# Patient Record
Sex: Female | Born: 1985 | Race: Black or African American | Hispanic: No | Marital: Single
Health system: Southern US, Community
[De-identification: ages and names within clinical notes are randomized; demographics above are authoritative.]

## PROBLEM LIST (undated history)

## (undated) DIAGNOSIS — K56609 Unspecified intestinal obstruction, unspecified as to partial versus complete obstruction: Secondary | ICD-10-CM

## (undated) HISTORY — PX: BOWEL RESECTION: SHX1257

---

## 2016-06-21 ENCOUNTER — Encounter (HOSPITAL_COMMUNITY): Payer: Self-pay | Admitting: Emergency Medicine

## 2016-06-21 ENCOUNTER — Inpatient Hospital Stay (HOSPITAL_COMMUNITY)
Admission: EM | Admit: 2016-06-21 | Discharge: 2016-06-24 | DRG: 390 | Disposition: A | Discharge status: Home / Self Care | Payer: Self-pay | Attending: Surgery | Admitting: Surgery

## 2016-06-21 ENCOUNTER — Emergency Department (HOSPITAL_COMMUNITY): Payer: Self-pay

## 2016-06-21 ENCOUNTER — Inpatient Hospital Stay (HOSPITAL_COMMUNITY): Payer: Self-pay

## 2016-06-21 DIAGNOSIS — E669 Obesity, unspecified: Secondary | ICD-10-CM

## 2016-06-21 DIAGNOSIS — K589 Irritable bowel syndrome without diarrhea: Secondary | ICD-10-CM | POA: Diagnosis present

## 2016-06-21 DIAGNOSIS — K56609 Unspecified intestinal obstruction, unspecified as to partial versus complete obstruction: Secondary | ICD-10-CM | POA: Diagnosis present

## 2016-06-21 DIAGNOSIS — K56600 Partial intestinal obstruction, unspecified as to cause: Principal | ICD-10-CM | POA: Diagnosis present

## 2016-06-21 DIAGNOSIS — Z9049 Acquired absence of other specified parts of digestive tract: Secondary | ICD-10-CM

## 2016-06-21 DIAGNOSIS — Z8719 Personal history of other diseases of the digestive system: Secondary | ICD-10-CM

## 2016-06-21 DIAGNOSIS — Z0189 Encounter for other specified special examinations: Secondary | ICD-10-CM

## 2016-06-21 DIAGNOSIS — Z6838 Body mass index (BMI) 38.0-38.9, adult: Secondary | ICD-10-CM

## 2016-06-21 HISTORY — DX: Unspecified intestinal obstruction, unspecified as to partial versus complete obstruction: K56.609

## 2016-06-21 LAB — URINALYSIS, ROUTINE W REFLEX MICROSCOPIC
BILIRUBIN URINE: NEGATIVE
Glucose, UA: NEGATIVE mg/dL
Hgb urine dipstick: NEGATIVE
KETONES UR: 20 mg/dL — AB
Nitrite: NEGATIVE
PROTEIN: NEGATIVE mg/dL
Specific Gravity, Urine: >1.046 — ABNORMAL HIGH (ref 1.005–1.030)
pH: 7 (ref 5.0–8.0)

## 2016-06-21 LAB — COMPREHENSIVE METABOLIC PANEL
ALT: 19 U/L (ref 14–54)
AST: 30 U/L (ref 15–41)
Albumin: 4.4 g/dL (ref 3.5–5.0)
Alkaline Phosphatase: 65 U/L (ref 38–126)
Anion gap: 8 (ref 5–15)
BUN: 7 mg/dL (ref 6–20)
CHLORIDE: 105 mmol/L (ref 101–111)
CO2: 23 mmol/L (ref 22–32)
CREATININE: 0.87 mg/dL (ref 0.44–1.00)
Calcium: 9.4 mg/dL (ref 8.9–10.3)
GFR calc non Af Amer: >60 mL/min (ref >60)
Glucose, Bld: 101 mg/dL — ABNORMAL HIGH (ref 65–99)
Potassium: 4.8 mmol/L (ref 3.5–5.1)
SODIUM: 136 mmol/L (ref 135–145)
Total Bilirubin: 1.2 mg/dL (ref 0.3–1.2)
Total Protein: 7.7 g/dL (ref 6.5–8.1)

## 2016-06-21 LAB — CBC
HCT: 41.5 % (ref 36.0–46.0)
HEMOGLOBIN: 13.5 g/dL (ref 12.0–15.0)
MCH: 29.3 pg (ref 26.0–34.0)
MCHC: 32.5 g/dL (ref 30.0–36.0)
MCV: 90 fL (ref 78.0–100.0)
Platelets: 212 10*3/uL (ref 150–400)
RBC: 4.61 MIL/uL (ref 3.87–5.11)
RDW: 13.6 % (ref 11.5–15.5)
WBC: 13 10*3/uL — ABNORMAL HIGH (ref 4.0–10.5)

## 2016-06-21 LAB — I-STAT BETA HCG BLOOD, ED (MC, WL, AP ONLY)

## 2016-06-21 LAB — LIPASE, BLOOD: LIPASE: 25 U/L (ref 11–51)

## 2016-06-21 MED ORDER — KETOROLAC TROMETHAMINE 30 MG/ML IJ SOLN: 30.0000 mg | Freq: Four times a day (QID) | INTRAMUSCULAR | Status: DC | PRN

## 2016-06-21 MED ORDER — ENOXAPARIN SODIUM 40 MG/0.4ML ~~LOC~~ SOLN
40.0000 mg | SUBCUTANEOUS | Status: DC
  Filled 2016-06-21 (×2): qty 0.4

## 2016-06-21 MED ORDER — WHITE PETROLATUM GEL
Status: AC
  Administered 2016-06-22: 1
  Filled 2016-06-21: qty 1

## 2016-06-21 MED ORDER — ONDANSETRON HCL 4 MG/2ML IJ SOLN
4.0000 mg | Freq: Once | INTRAMUSCULAR | Status: AC
  Administered 2016-06-21: 4 mg via INTRAVENOUS
  Filled 2016-06-21: qty 2

## 2016-06-21 MED ORDER — DIATRIZOATE MEGLUMINE & SODIUM 66-10 % PO SOLN
90.0000 mL | Freq: Once | ORAL | Status: AC
  Administered 2016-06-22: 90 mL via NASOGASTRIC
  Filled 2016-06-21: qty 90

## 2016-06-21 MED ORDER — DEXTROSE-NACL 5-0.9 % IV SOLN
INTRAVENOUS | Status: DC
  Administered 2016-06-22 – 2016-06-24 (×5): via INTRAVENOUS

## 2016-06-21 MED ORDER — HYDRALAZINE HCL 20 MG/ML IJ SOLN: 10.0000 mg | INTRAMUSCULAR | Status: DC | PRN

## 2016-06-21 MED ORDER — ONDANSETRON 4 MG PO TBDP: 4.0000 mg | ORAL_TABLET | Freq: Four times a day (QID) | ORAL | Status: DC | PRN

## 2016-06-21 MED ORDER — KETOROLAC TROMETHAMINE 30 MG/ML IJ SOLN
30.0000 mg | Freq: Four times a day (QID) | INTRAMUSCULAR | Status: DC | PRN
  Administered 2016-06-22: 30 mg via INTRAVENOUS
  Filled 2016-06-21: qty 1

## 2016-06-21 MED ORDER — HYDROMORPHONE HCL 1 MG/ML IJ SOLN
1.0000 mg | Freq: Once | INTRAMUSCULAR | Status: AC
  Administered 2016-06-21: 1 mg via INTRAVENOUS
  Filled 2016-06-21: qty 1

## 2016-06-21 MED ORDER — IOPAMIDOL (ISOVUE-300) INJECTION 61%
INTRAVENOUS | Status: AC
  Administered 2016-06-21: 100 mL
  Filled 2016-06-21: qty 100

## 2016-06-21 MED ORDER — ONDANSETRON HCL 4 MG/2ML IJ SOLN
4.0000 mg | Freq: Four times a day (QID) | INTRAMUSCULAR | Status: DC | PRN
  Administered 2016-06-22 (×3): 4 mg via INTRAVENOUS
  Filled 2016-06-21 (×3): qty 2

## 2016-06-21 MED ORDER — HYDROMORPHONE HCL 1 MG/ML IJ SOLN
1.0000 mg | INTRAMUSCULAR | Status: DC | PRN
  Administered 2016-06-21 – 2016-06-22 (×4): 1 mg via INTRAVENOUS
  Filled 2016-06-21 (×4): qty 1

## 2016-06-21 NOTE — ED Notes (Signed)
Pt states she is not in pain at this time.

## 2016-06-21 NOTE — ED Notes (Signed)
On way to CT 

## 2016-06-21 NOTE — H&P (Signed)
Kathryn Fernandez is an 31 y.o. female.   Chief Complaint: Abdominal pain HPI: The patient presents with a one-day history of abdominal pain to the emergency department. I was asked to see the patient at the request of Dr. Jeneen Rinks. Patient relates developing crampy abdominal pain earlier this morning. She is in town for a family funeral and lives near [place]. During his service, she developed more severe lower crampy abdominal pain. This was associated with nausea and vomiting. She's had multiple episodes of nausea and vomiting. No recent bowel movement today. She is not passing flatus. 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 I do not have records for these operations. Her last operation was over 8 years ago. She's had one episode of small bowel obstruction managed nonoperatively. CT scan shows high-grade distal small bowel obstruction.  Past Medical History:  Diagnosis Date  . Bowel obstruction [facility])     Past Surgical History:  Procedure Laterality Date  . BOWEL RESECTION      No family history on file. Social History:  has no tobacco, alcohol, and drug history on file.  Allergies: No Known Allergies   (Not in a hospital admission)  Results for orders placed or performed during the hospital encounter of 06/21/16 (from the past 48 hour(s))  Lipase, blood     Status: None   Collection Time: 06/21/16  6:08 PM  Result Value Ref Range   Lipase 25 11 - 51 U/L  Comprehensive metabolic panel     Status: Abnormal   Collection Time: 06/21/16  6:08 PM  Result Value Ref Range   Sodium 136 135 - 145 mmol/L   Potassium 4.8 3.5 - 5.1 mmol/L    Comment: SLIGHT HEMOLYSIS   Chloride 105 101 - 111 mmol/L   CO2 23 22 - 32 mmol/L   Glucose, Bld 101 (H) 65 - 99 mg/dL   BUN 7 6 - 20 mg/dL   Creatinine, Ser 0.87 0.44 - 1.00 mg/dL   Calcium 9.4 8.9 - 10.3 mg/dL   Total Protein 7.7 6.5 - 8.1 g/dL   Albumin 4.4 3.5 - 5.0 g/dL   AST 30 15 -  41 U/L   ALT 19 14 - 54 U/L   Alkaline Phosphatase 65 38 - 126 U/L   Total Bilirubin 1.2 0.3 - 1.2 mg/dL   GFR calc non Af Amer >60 >60 mL/min   GFR calc Af Amer >60 >60 mL/min    Comment: (NOTE) The eGFR has been calculated using the CKD EPI equation. This calculation has not been validated in all clinical situations. eGFR's persistently <60 mL/min signify possible Chronic Kidney Disease.    Anion gap 8 5 - 15  CBC     Status: Abnormal   Collection Time: 06/21/16  6:08 PM  Result Value Ref Range   WBC 13.0 (H) 4.0 - 10.5 K/uL   RBC 4.61 3.87 - 5.11 MIL/uL   Hemoglobin 13.5 12.0 - 15.0 g/dL   HCT 41.5 36.0 - 46.0 %   MCV 90.0 78.0 - 100.0 fL   MCH 29.3 26.0 - 34.0 pg   MCHC 32.5 30.0 - 36.0 g/dL   RDW 13.6 11.5 - 15.5 %   Platelets 212 150 - 400 K/uL  I-Stat beta hCG blood, ED     Status: None   Collection Time: 06/21/16  6:27 PM  Result Value Ref Range   I-stat hCG, quantitative <5.0 <5 mIU/mL   Comment 3  Comment:   GEST. AGE      CONC.  (mIU/mL)   <=1 WEEK        5 - 50     2 WEEKS       50 - 500     3 WEEKS       100 - 10,000     4 WEEKS     1,000 - 30,000        FEMALE AND NON-PREGNANT FEMALE:     LESS THAN 5 mIU/mL    Ct Abdomen Pelvis W Contrast  Result Date: 06/21/2016 CLINICAL DATA:  Central abdominal pain and vomiting starting this morning. History of 3 abdominal surgeries and bladder surgery. History of bowel obstruction. EXAM: CT ABDOMEN AND PELVIS WITH CONTRAST TECHNIQUE: Multidetector CT imaging of the abdomen and pelvis was performed using the standard protocol following bolus administration of intravenous contrast. CONTRAST:  165m ISOVUE-300 IOPAMIDOL (ISOVUE-300) INJECTION 61% COMPARISON:  None. FINDINGS: Lower chest: No acute abnormality. Hepatobiliary: No focal liver abnormality is seen. No gallstones, gallbladder wall thickening, or biliary dilatation. Pancreas: Unremarkable. No pancreatic ductal dilatation or surrounding inflammatory changes.  Spleen: Normal in size without focal abnormality. Adrenals/Urinary Tract: Adrenal glands are unremarkable. Kidneys are normal, without renal calculi, focal lesion, or hydronephrosis. Bladder is unremarkable. Stomach/Bowel: Contracted stomach. Normal small bowel rotation. There is a small bowel obstruction involving jejunal loops with transition zone in the right lower quadrant possibly from a small adhesion, coronal series 6, image 38, and axial series 3, image 59. Fecalized material is seen within the distal jejunum leading up to the transition point with small bowel measuring up to 4.5 cm in diameter. Vascular/Lymphatic: No significant vascular findings are present. No enlarged abdominal or pelvic lymph nodes. Reproductive: An intrauterine device is seen within the expected location of the endometrial cavity. No uterine mass or adnexal abnormality. Other: No abdominal wall hernia or abnormality. No abdominopelvic ascites. Musculoskeletal: No acute or significant osseous findings. IMPRESSION: High-grade small bowel obstruction with transition point in the right lower quadrant possibly secondary to an adhesion or subtle stricture. Fecalized material is seen within proximal bowel loops leading up to this area of narrowing with small bowel measuring up to 4.5 cm in caliber. Electronically Signed   By: DAshley RoyaltyM.D.   On: 06/21/2016 19:39    Review of Systems  Constitutional: Positive for diaphoresis and malaise/fatigue. Negative for chills and fever.  HENT: Negative for hearing loss and tinnitus.   Eyes: Negative for blurred vision and double vision.  Respiratory: Negative for cough.   Cardiovascular: Negative for chest pain.  Gastrointestinal: Positive for abdominal pain, nausea and vomiting.  Genitourinary: Negative.   Musculoskeletal: Negative.   Skin: Negative for itching.  Neurological: Negative for dizziness and headaches.  Endo/Heme/Allergies: Negative for environmental allergies. Does not  bruise/bleed easily.  Psychiatric/Behavioral: Negative for depression and suicidal ideas.    Blood pressure 113/76, pulse 84, temperature 98.8 F (37.1 C), temperature source Oral, resp. rate 18, last menstrual period 05/31/2016, SpO2 100 %. Physical Exam  Constitutional: She is oriented to person, place, and time. She appears well-nourished.  HENT:  Head: Normocephalic and atraumatic.  Mouth/Throat: No oropharyngeal exudate.  Eyes: Pupils are equal, round, and reactive to light. No scleral icterus.  Neck: Normal range of motion. Neck supple.  Cardiovascular: Normal rate and regular rhythm.   No murmur heard. Respiratory: Effort normal and breath sounds normal. No respiratory distress.  GI: She exhibits distension. There is tenderness in the right lower  quadrant and left lower quadrant. There is no rigidity, no rebound and no guarding. No hernia.    Musculoskeletal: Normal range of motion.  Neurological: She is alert and oriented to person, place, and time.  Skin: Skin is warm and dry.     Assessment/Plan Recurrent small bowel obstruction high-grade  Initiate small bowel obstruction protocol  IV fluids, nasogastric tube decompression, nothing by mouth and recheck labs in a.m.  History of multiple laparotomies for "adhesions". She has been managed nonoperatively. She could have a stricture from a previous bowel resection as well. She has no acute need for surgery currently but could during this hospital demonstration. I discussed this with her tonight as well as nonoperative approaches. She has no signs of peritonitis this point in time.  Colletta Spillers A., MD 06/21/2016, 8:14 PM

## 2016-06-21 NOTE — ED Provider Notes (Signed)
[facility]-EMERGENCY DEPT Provider Note   CSN: [id number] Arrival date & time: 06/21/16  1754     History   Chief Complaint Chief Complaint  Patient presents with  . Abdominal Pain    HPI Kathryn Fernandez is a 31 y.o. female.  Patient with past medical history remarkable for bowel obstruction, bowel resection, and multiple prior abdominal surgeries presents to the emergency department with chief complaint of diffuse abdominal pain. She reports that the pain started yesterday and has progressively worsened. She rates the pain is 10 out of 10. She states that it is all over her abdomen. It is worsened with palpation and movement. She reports associated vomiting, but denies any diarrhea. She denies any fevers or chills. She has not tried taking anything for symptoms. She states that she has not felt this bad in the past 8 years.   The history is provided by the patient. No language interpreter was used.    Past Medical History:  Diagnosis Date  . Bowel obstruction (HCC)     There are no active problems to display for this patient.   Past Surgical History:  Procedure Laterality Date  . BOWEL RESECTION      OB History    No data available       Home Medications    Prior to Admission medications   Not on File    Family History No family history on file.  Social History Social History  Substance Use Topics  . Smoking status: Not on file  . Smokeless tobacco: Not on file  . Alcohol use Not on file     Allergies   Patient has no known allergies.   Review of Systems Review of Systems  All other systems reviewed and are negative.    Physical Exam Updated Vital Signs BP 122/65   Pulse 82   Temp 98.8 F (37.1 C) (Oral)   Resp 18   LMP 05/31/2016   SpO2 99%   Physical Exam  Constitutional: She is oriented to person, place, and time. She appears well-developed and well-nourished.  HENT:  Head: Normocephalic and atraumatic.  Eyes: Conjunctivae and EOM are  normal. Pupils are equal, round, and reactive to light.  Neck: Normal range of motion. Neck supple.  Cardiovascular: Normal rate and regular rhythm.  Exam reveals no gallop and no friction rub.   No murmur heard. Pulmonary/Chest: Effort normal and breath sounds normal. No respiratory distress. She has no wheezes. She has no rales. She exhibits no tenderness.  Abdominal: Soft. Bowel sounds are normal. She exhibits no distension and no mass. There is tenderness. There is no rebound and no guarding.  Lower abdomen is tender to palpation, she does have bowel sounds in all 4 quadrants, however, bowel sounds are hyperactive in the upper quadrants  Musculoskeletal: Normal range of motion. She exhibits no edema or tenderness.  Neurological: She is alert and oriented to person, place, and time.  Skin: Skin is warm and dry.  Psychiatric: She has a normal mood and affect. Her behavior is normal. Judgment and thought content normal.  Nursing note and vitals reviewed.    ED Treatments / Results  Labs (all labs ordered are listed, but only abnormal results are displayed) Labs Reviewed  COMPREHENSIVE METABOLIC PANEL - Abnormal; Notable for the following:       Result Value   Glucose, Bld 101 (*)    All other components within normal limits  CBC - Abnormal; Notable for the following:    WBC  13.0 (*)    All other components within normal limits  LIPASE, BLOOD  URINALYSIS, ROUTINE W REFLEX MICROSCOPIC  I-STAT BETA HCG BLOOD, ED ([facility], [facility], [facility] ONLY)    EKG  EKG Interpretation None       Radiology Ct Abdomen Pelvis W Contrast  Result Date: 06/21/2016 CLINICAL DATA:  Central abdominal pain and vomiting starting this morning. History of 3 abdominal surgeries and bladder surgery. History of bowel obstruction. EXAM: CT ABDOMEN AND PELVIS WITH CONTRAST TECHNIQUE: Multidetector CT imaging of the abdomen and pelvis was performed using the standard protocol following bolus administration of intravenous  contrast. CONTRAST:  ISOVUE-300 IOPAMIDOL (ISOVUE-300) INJECTION 61% COMPARISON:  None. FINDINGS: Lower chest: No acute abnormality. Hepatobiliary: No focal liver abnormality is seen. No gallstones, gallbladder wall thickening, or biliary dilatation. Pancreas: Unremarkable. No pancreatic ductal dilatation or surrounding inflammatory changes. Spleen: Normal in size without focal abnormality. Adrenals/Urinary Tract: Adrenal glands are unremarkable. Kidneys are normal, without renal calculi, focal lesion, or hydronephrosis. Bladder is unremarkable. Stomach/Bowel: Contracted stomach. Normal small bowel rotation. There is a small bowel obstruction involving jejunal loops with transition zone in the right lower quadrant possibly from a small adhesion, coronal series 6, image 38, and axial series 3, image 59. Fecalized material is seen within the distal jejunum leading up to the transition point with small bowel measuring up to 4.5 cm in diameter. Vascular/Lymphatic: No significant vascular findings are present. No enlarged abdominal or pelvic lymph nodes. Reproductive: An intrauterine device is seen within the expected location of the endometrial cavity. No uterine mass or adnexal abnormality. Other: No abdominal wall hernia or abnormality. No abdominopelvic ascites. Musculoskeletal: No acute or significant osseous findings. IMPRESSION: High-grade small bowel obstruction with transition point in the right lower quadrant possibly secondary to an adhesion or subtle stricture. Fecalized material is seen within proximal bowel loops leading up to this area of narrowing with small bowel measuring up to 4.5 cm in caliber. Electronically Signed   By: Tollie Eth M.D.   On: 06/21/2016 19:39    Procedures Procedures (including critical care time)  Medications Ordered in ED Medications  HYDROmorphone (DILAUDID) injection 1 mg (1 mg Intravenous Given 06/21/16 1828)  ondansetron (ZOFRAN) injection 4 mg (4 mg Intravenous  Given 06/21/16 1826)  iopamidol (ISOVUE-300) 61 % injection (100 mLs  Contrast Given 06/21/16 1911)     Initial Impression / Assessment and Plan / ED Course  I have reviewed the triage vital signs and the nursing notes.  Pertinent labs & imaging results that were available during my care of the patient were reviewed by me and considered in my medical decision making (see chart for details).     Patient with multiple prior abdominal surgeries for bowel obstruction presents with abdominal pain, nausea, and vomiting. Pain is severe. Vital signs are stable. Patient is afebrile. We'll treat pain and nausea. Will check CT abdomen/pelvis to rule out obstruction.  CT is consistent with high-grade bowel obstruction. I discussed these findings with Dr. Luisa Hart of general surgery, who is appreciated greatly for admitting the patient. Recommends placing an NG tube and keeping patient nothing by mouth. Patient's pain is 0 out of 10 at this time. She understands the plan.  Discussed with Dr. Fayrene Fearing, who also agrees with the plan.  Final Clinical Impressions(s) / ED Diagnoses   Final diagnoses:  SBO (small bowel obstruction) (HCC)    New Prescriptions New Prescriptions   No medications on file     Roxy Horseman, PA-C 06/21/16  [place]    Rolland Porter, MD 07/06/16 2356

## 2016-06-21 NOTE — ED Triage Notes (Signed)
Pt c/o abdominal pain and vomiting since yesterday, states shes had three separate surgeries for bowel obstructions, hx of adhesions, IBS. Pt 10/10 pain, last BM yesterday.

## 2016-06-22 ENCOUNTER — Inpatient Hospital Stay (HOSPITAL_COMMUNITY): Payer: Self-pay

## 2016-06-22 DIAGNOSIS — E669 Obesity, unspecified: Secondary | ICD-10-CM

## 2016-06-22 LAB — CBC
HCT: 38.9 % (ref 36.0–46.0)
HEMOGLOBIN: 12.2 g/dL (ref 12.0–15.0)
MCH: 28.6 pg (ref 26.0–34.0)
MCHC: 31.4 g/dL (ref 30.0–36.0)
MCV: 91.3 fL (ref 78.0–100.0)
PLATELETS: 180 10*3/uL (ref 150–400)
RBC: 4.26 MIL/uL (ref 3.87–5.11)
RDW: 13.5 % (ref 11.5–15.5)
WBC: 12 10*3/uL — ABNORMAL HIGH (ref 4.0–10.5)

## 2016-06-22 LAB — COMPREHENSIVE METABOLIC PANEL
ALBUMIN: 3.9 g/dL (ref 3.5–5.0)
ALK PHOS: 52 U/L (ref 38–126)
ALT: 17 U/L (ref 14–54)
ANION GAP: 6 (ref 5–15)
AST: 18 U/L (ref 15–41)
BILIRUBIN TOTAL: 0.8 mg/dL (ref 0.3–1.2)
BUN: 7 mg/dL (ref 6–20)
CO2: 26 mmol/L (ref 22–32)
Calcium: 9 mg/dL (ref 8.9–10.3)
Chloride: 106 mmol/L (ref 101–111)
Creatinine, Ser: 0.84 mg/dL (ref 0.44–1.00)
GFR calc Af Amer: >60 mL/min (ref >60)
GFR calc non Af Amer: >60 mL/min (ref >60)
GLUCOSE: 131 mg/dL — AB (ref 65–99)
Potassium: 3.9 mmol/L (ref 3.5–5.1)
Sodium: 138 mmol/L (ref 135–145)
TOTAL PROTEIN: 7 g/dL (ref 6.5–8.1)

## 2016-06-22 LAB — HIV ANTIBODY (ROUTINE TESTING W REFLEX): HIV Screen 4th Generation wRfx: NONREACTIVE

## 2016-06-22 MED ORDER — PROCHLORPERAZINE EDISYLATE 5 MG/ML IJ SOLN: 5.0000 mg | INTRAMUSCULAR | Status: DC | PRN

## 2016-06-22 MED ORDER — DIPHENHYDRAMINE HCL 50 MG/ML IJ SOLN: 12.5000 mg | Freq: Four times a day (QID) | INTRAMUSCULAR | Status: DC | PRN

## 2016-06-22 MED ORDER — MENTHOL 3 MG MT LOZG: 1.0000 | LOZENGE | OROMUCOSAL | Status: DC | PRN

## 2016-06-22 MED ORDER — DEXTROSE 5 % IV SOLN
1000.0000 mg | Freq: Four times a day (QID) | INTRAVENOUS | Status: DC | PRN
  Filled 2016-06-22 (×3): qty 10

## 2016-06-22 MED ORDER — METOPROLOL TARTRATE 5 MG/5ML IV SOLN: 5.0000 mg | Freq: Four times a day (QID) | INTRAVENOUS | Status: DC | PRN

## 2016-06-22 MED ORDER — METHOCARBAMOL 1000 MG/10ML IJ SOLN
1000.0000 mg | Freq: Three times a day (TID) | INTRAVENOUS | Status: AC
  Administered 2016-06-22: 1000 mg via INTRAVENOUS
  Filled 2016-06-22 (×2): qty 10

## 2016-06-22 MED ORDER — LACTATED RINGERS IV BOLUS (SEPSIS): 1000.0000 mL | Freq: Three times a day (TID) | INTRAVENOUS | Status: AC | PRN

## 2016-06-22 MED ORDER — ORAL CARE MOUTH RINSE
15.0000 mL | Freq: Two times a day (BID) | OROMUCOSAL | Status: DC
  Administered 2016-06-22 – 2016-06-24 (×3): 15 mL via OROMUCOSAL

## 2016-06-22 MED ORDER — MAGIC MOUTHWASH: 15.0000 mL | Freq: Four times a day (QID) | ORAL | Status: DC | PRN

## 2016-06-22 MED ORDER — BLISTEX MEDICATED EX OINT
1.0000 "application " | TOPICAL_OINTMENT | Freq: Two times a day (BID) | CUTANEOUS | Status: DC
  Administered 2016-06-22 – 2016-06-24 (×4): 1 via TOPICAL
  Filled 2016-06-22 (×2): qty 6.3

## 2016-06-22 MED ORDER — BISACODYL 10 MG RE SUPP
10.0000 mg | Freq: Every day | RECTAL | Status: DC
  Administered 2016-06-22: 10 mg via RECTAL
  Filled 2016-06-22 (×2): qty 1

## 2016-06-22 MED ORDER — MORPHINE SULFATE (PF) 4 MG/ML IV SOLN
2.0000 mg | INTRAVENOUS | Status: DC | PRN
  Administered 2016-06-22 – 2016-06-23 (×4): 4 mg via INTRAVENOUS
  Filled 2016-06-22 (×4): qty 1

## 2016-06-22 MED ORDER — PHENOL 1.4 % MT LIQD: 2.0000 | OROMUCOSAL | Status: DC | PRN

## 2016-06-22 NOTE — Progress Notes (Signed)
[facility]  [address] [place], [place] [address] Phone: [phone] FAX: [phone]   Rafael Quesada [id number] 04-Feb-1986    Problem List:   Principal Problem:   SBO (small bowel obstruction) ([facility]) Active Problems:   Obesity           Assessment  SBO  Plan:  -NGT -SBO protocol with serial films/exams -improve pain control -VTE prophylaxis- SCDs, etc -mobilize as tolerated to help recovery  Adin Hector, M.D., F.A.C.S. Gastrointestinal and Minimally Invasive Surgery [facility] [address] [place], [place] [address] [phone] Main / Paging   06/22/2016  CARE TEAM:  PCP: No PCP Per Patient  Outpatient Care Team: Patient Care Team: No Pcp Per Patient as PCP - General (General Practice)  Inpatient Treatment Team: Treatment Team: Attending Provider: Nolon Nations, MD; Registered Nurse: Ernst Breach, RN; Technician: Gasper Sells, NT; Student Nurse: Thalia Party, Student-RN; Registered Nurse: Clydia Llano, RN  Subjective:  Sore - pain meds wear off Family at bedside exhausted  Objective:  Vital signs:  Vitals:   06/21/16 2230 06/21/16 2331 06/22/16 0222 06/22/16 0552  BP: 126/88 128/85 123/79 125/74  Pulse: 81 85 78 81  Resp:  _0 Temp:  97.9 F (36.6 C) 98.5 F (36.9 C) 98.1 F (36.7 C)  TempSrc:  Oral Oral Oral  SpO2: 94% 97% 100% 98%  Weight:  99.5 kg (219 lb 6.4 oz)    Height:  _1  (1.6 m)      Last BM Date: 06/20/16  Intake/Output   Yesterday:  04/28 0701 - 04/29 0700 In: 718.8 [I.V.:718.8] Out: 200 [Emesis/NG output:200] This shift:  No intake/output data recorded.  Bowel function:  Flatus: No  BM:  No  Drain: NGT thinly bilious   Physical Exam:  General: Pt awake/alert/oriented x4 inmild acute distress Eyes: PERRL, normal EOM.  Sclera clear.  No icterus Neuro: CN II-XII intact w/o focal sensory/motor  deficits. Lymph: No head/neck/groin lymphadenopathy Psych:  No delerium/psychosis/paranoia HENT: Normocephalic, Mucus membranes moist.  No thrush Neck: Supple, No tracheal deviation Chest: No chest wall pain w good excursion CV:  Pulses intact.  Regular rhythm MS: Normal AROM mjr joints.  No obvious deformity Abdomen: Somewhat firm.  Mildy distended.  Nontender.  No evidence of peritonitis.  No incarcerated hernias. Ext:  SCDs BLE.  No mjr edema.  No cyanosis Skin: No petechiae / purpura  Results:   Labs: Results for orders placed or performed during the hospital encounter of 06/21/16 (from the past 48 hour(s))  Urinalysis, Routine w reflex microscopic     Status: Abnormal   Collection Time: 06/21/16  6:02 PM  Result Value Ref Range   Color, Urine YELLOW YELLOW   APPearance CLEAR CLEAR   Specific Gravity, Urine >1.046 (H) 1.005 - 1.030   pH 7.0 5.0 - 8.0   Glucose, UA NEGATIVE NEGATIVE mg/dL   Hgb urine dipstick NEGATIVE NEGATIVE   Bilirubin Urine NEGATIVE NEGATIVE   Ketones, ur 20 (A) NEGATIVE mg/dL   Protein, ur NEGATIVE NEGATIVE mg/dL   Nitrite NEGATIVE NEGATIVE   Leukocytes, UA SMALL (A) NEGATIVE   RBC / HPF 0-5 0 - 5 RBC/hpf   WBC, UA 0-5 0 - 5 WBC/hpf   Bacteria, UA RARE (A) NONE SEEN   Squamous Epithelial / LPF 0-5 (A) NONE SEEN   Mucous PRESENT   Lipase, blood     Status: None   Collection  Time: 06/21/16  6:08 PM  Result Value Ref Range   Lipase 25 11 - 51 U/L  Comprehensive metabolic panel     Status: Abnormal   Collection Time: 06/21/16  6:08 PM  Result Value Ref Range   Sodium 136 135 - 145 mmol/L   Potassium 4.8 3.5 - 5.1 mmol/L    Comment: SLIGHT HEMOLYSIS   Chloride 105 101 - 111 mmol/L   CO2 23 22 - 32 mmol/L   Glucose, Bld 101 (H) 65 - 99 mg/dL   BUN 7 6 - 20 mg/dL   Creatinine, Ser 0.87 0.44 - 1.00 mg/dL   Calcium 9.4 8.9 - 10.3 mg/dL   Total Protein 7.7 6.5 - 8.1 g/dL   Albumin 4.4 3.5 - 5.0 g/dL   AST 30 15 - 41 U/L   ALT 19 14 - 54 U/L    Alkaline Phosphatase 65 38 - 126 U/L   Total Bilirubin 1.2 0.3 - 1.2 mg/dL   GFR calc non Af Amer >60 >60 mL/min   GFR calc Af Amer >60 >60 mL/min    Comment: (NOTE) The eGFR has been calculated using the CKD EPI equation. This calculation has not been validated in all clinical situations. eGFR's persistently <60 mL/min signify possible Chronic Kidney Disease.    Anion gap 8 5 - 15  CBC     Status: Abnormal   Collection Time: 06/21/16  6:08 PM  Result Value Ref Range   WBC 13.0 (H) 4.0 - 10.5 K/uL   RBC 4.61 3.87 - 5.11 MIL/uL   Hemoglobin 13.5 12.0 - 15.0 g/dL   HCT 41.5 36.0 - 46.0 %   MCV 90.0 78.0 - 100.0 fL   MCH 29.3 26.0 - 34.0 pg   MCHC 32.5 30.0 - 36.0 g/dL   RDW 13.6 11.5 - 15.5 %   Platelets 212 150 - 400 K/uL  I-Stat beta hCG blood, ED     Status: None   Collection Time: 06/21/16  6:27 PM  Result Value Ref Range   I-stat hCG, quantitative <5.0 <5 mIU/mL   Comment 3            Comment:   GEST. AGE      CONC.  (mIU/mL)   <=1 WEEK        5 - 50     2 WEEKS       50 - 500     3 WEEKS       100 - 10,000     4 WEEKS     1,000 - 30,000        FEMALE AND NON-PREGNANT FEMALE:     LESS THAN 5 mIU/mL   Comprehensive metabolic panel     Status: Abnormal   Collection Time: 06/22/16  4:46 AM  Result Value Ref Range   Sodium 138 135 - 145 mmol/L   Potassium 3.9 3.5 - 5.1 mmol/L    Comment: DELTA CHECK NOTED   Chloride 106 101 - 111 mmol/L   CO2 26 22 - 32 mmol/L   Glucose, Bld 131 (H) 65 - 99 mg/dL   BUN 7 6 - 20 mg/dL   Creatinine, Ser 0.84 0.44 - 1.00 mg/dL   Calcium 9.0 8.9 - 10.3 mg/dL   Total Protein 7.0 6.5 - 8.1 g/dL   Albumin 3.9 3.5 - 5.0 g/dL   AST 18 15 - 41 U/L   ALT 17 14 - 54 U/L   Alkaline Phosphatase 52 38 - 126 U/L  Total Bilirubin 0.8 0.3 - 1.2 mg/dL   GFR calc non Af Amer >60 >60 mL/min   GFR calc Af Amer >60 >60 mL/min    Comment: (NOTE) The eGFR has been calculated using the CKD EPI equation. This calculation has not been validated in all  clinical situations. eGFR's persistently <60 mL/min signify possible Chronic Kidney Disease.    Anion gap 6 5 - 15  CBC     Status: Abnormal   Collection Time: 06/22/16  4:46 AM  Result Value Ref Range   WBC 12.0 (H) 4.0 - 10.5 K/uL   RBC 4.26 3.87 - 5.11 MIL/uL   Hemoglobin 12.2 12.0 - 15.0 g/dL   HCT 38.9 36.0 - 46.0 %   MCV 91.3 78.0 - 100.0 fL   MCH 28.6 26.0 - 34.0 pg   MCHC 31.4 30.0 - 36.0 g/dL   RDW 13.5 11.5 - 15.5 %   Platelets 180 150 - 400 K/uL    Imaging / Studies: Ct Abdomen Pelvis W Contrast  Result Date: 06/21/2016 CLINICAL DATA:  Central abdominal pain and vomiting starting this morning. History of 3 abdominal surgeries and bladder surgery. History of bowel obstruction. EXAM: CT ABDOMEN AND PELVIS WITH CONTRAST TECHNIQUE: Multidetector CT imaging of the abdomen and pelvis was performed using the standard protocol following bolus administration of intravenous contrast. CONTRAST:  137m ISOVUE-300 IOPAMIDOL (ISOVUE-300) INJECTION 61% COMPARISON:  None. FINDINGS: Lower chest: No acute abnormality. Hepatobiliary: No focal liver abnormality is seen. No gallstones, gallbladder wall thickening, or biliary dilatation. Pancreas: Unremarkable. No pancreatic ductal dilatation or surrounding inflammatory changes. Spleen: Normal in size without focal abnormality. Adrenals/Urinary Tract: Adrenal glands are unremarkable. Kidneys are normal, without renal calculi, focal lesion, or hydronephrosis. Bladder is unremarkable. Stomach/Bowel: Contracted stomach. Normal small bowel rotation. There is a small bowel obstruction involving jejunal loops with transition zone in the right lower quadrant possibly from a small adhesion, coronal series 6, image 38, and axial series 3, image 59. Fecalized material is seen within the distal jejunum leading up to the transition point with small bowel measuring up to 4.5 cm in diameter. Vascular/Lymphatic: No significant vascular findings are present. No enlarged  abdominal or pelvic lymph nodes. Reproductive: An intrauterine device is seen within the expected location of the endometrial cavity. No uterine mass or adnexal abnormality. Other: No abdominal wall hernia or abnormality. No abdominopelvic ascites. Musculoskeletal: No acute or significant osseous findings. IMPRESSION: High-grade small bowel obstruction with transition point in the right lower quadrant possibly secondary to an adhesion or subtle stricture. Fecalized material is seen within proximal bowel loops leading up to this area of narrowing with small bowel measuring up to 4.5 cm in caliber. Electronically Signed   By: DAshley RoyaltyM.D.   On: 06/21/2016 19:39   Dg Abd Portable 1v-small Bowel Protocol-position Verification  Result Date: 06/22/2016 CLINICAL DATA:  Nasogastric tube placement. EXAM: PORTABLE ABDOMEN - 1 VIEW COMPARISON:  CT abdomen and pelvis May 21, 2016 FINDINGS: A few loops of central small bowel is distended to 3.4 cm. Nasogastric tube tip and side port projecting in proximal stomach. No radio-opaque calculi or other significant radiographic abnormality are seen. IMPRESSION: Nasogastric tube tip projects in proximal stomach. Low-grade small bowel obstruction versus focal ileus. Electronically Signed   By: CElon AlasM.D.   On: 06/22/2016 00:43    Medications / Allergies: per chart  Antibiotics: Anti-infectives    None        Note: Portions of this report may have been  transcribed using voice recognition software. Every effort was made to ensure accuracy; however, inadvertent computerized transcription errors may be present.   Any transcriptional errors that result from this process are unintentional.     Adin Hector, M.D., F.A.C.S. Gastrointestinal and Minimally Invasive Surgery [facility] [address], [place], [place] [address] [phone] Main / Paging   06/22/2016

## 2016-06-23 ENCOUNTER — Inpatient Hospital Stay (HOSPITAL_COMMUNITY): Payer: Self-pay

## 2016-06-23 MED ORDER — FLEET ENEMA 7-19 GM/118ML RE ENEM
1.0000 | ENEMA | Freq: Once | RECTAL | Status: AC
  Administered 2016-06-23: 1 via RECTAL
  Filled 2016-06-23: qty 1

## 2016-06-23 NOTE — Progress Notes (Signed)
Not much relief or results with Fleets enema.  She has issues with chronic constipation and IBS.  He normal is to go to BR QOD, and not daily.  She does not take fiber because it makes her IBS worse.  I will give her a SSE this PM and see if it helps.  She is doing well with clears from the floor. Anxious to get NG out.

## 2016-06-23 NOTE — Progress Notes (Signed)
Subjective: She has had some flatus but no BM.  Few hypoactive BS.  Nothing coming from the NG.  I irrigated it and still go nothing.  Put a new air filter on sump.    Objective: Vital signs in last 24 hours: Temp:  [98.2 F (36.8 C)-99 F (37.2 C)] 99 F (37.2 C) (04/30 0521) Pulse Rate:  [82-88] 88 (04/30 0521) Resp:  [18] 18 (04/30 0521) BP: (115-135)/(63-86) 115/63 (04/30 0521) SpO2:  [97 %-100 %] 97 % (04/30 0521) Last BM Date: 06/20/16 NPO 1671 IV Urine 500 recorded NG 150 NO BM recorded Afebrile, VSS NO labs today labs yestrerday OK except WBC 12K and glucose of 131 Film yesterday 8 hour:  Enteric tube redemonstrated in the stomach. IUD redemonstrated. There is mild prominence of the central small bowel loops, probably unchanged from earlier. Colonic stool and air is redemonstrated.   Intake/Output from previous day: 04/29 0701 - 04/30 0700 In: 1761 [I.V.:1671; NG/GT:30; IV Piggyback:60] Out: 650 [Urine:500; Emesis/NG output:150] Intake/Output this shift: No intake/output data recorded.  General appearance: alert, cooperative and no distress Resp: clear to auscultation bilaterally GI: soft few BS, some flatus, no BM.  she is not distended or tender  Lab Results:   Recent Labs  06/21/16 1808 06/22/16 0446  WBC 13.0* 12.0*  HGB 13.5 12.2  HCT 41.5 38.9  PLT 212 180    BMET  Recent Labs  06/21/16 1808 06/22/16 0446  NA 136 138  K 4.8 3.9  CL 105 106  CO2 23 26  GLUCOSE 101* 131*  BUN 7 7  CREATININE 0.87 0.84  CALCIUM 9.4 9.0   PT/INR No results for input(s): LABPROT, INR in the last 72 hours.   Recent Labs Lab 06/21/16 1808 06/22/16 0446  AST 30 18  ALT 19 17  ALKPHOS 65 52  BILITOT 1.2 0.8  PROT 7.7 7.0  ALBUMIN 4.4 3.9     Lipase     Component Value Date/Time   LIPASE 25 06/21/2016 1808     Studies/Results: Ct Abdomen Pelvis W Contrast  Result Date: 06/21/2016 CLINICAL DATA:  Central abdominal pain and vomiting  starting this morning. History of 3 abdominal surgeries and bladder surgery. History of bowel obstruction. EXAM: CT ABDOMEN AND PELVIS WITH CONTRAST TECHNIQUE: Multidetector CT imaging of the abdomen and pelvis was performed using the standard protocol following bolus administration of intravenous contrast. CONTRAST:  ISOVUE-300 IOPAMIDOL (ISOVUE-300) INJECTION 61% COMPARISON:  None. FINDINGS: Lower chest: No acute abnormality. Hepatobiliary: No focal liver abnormality is seen. No gallstones, gallbladder wall thickening, or biliary dilatation. Pancreas: Unremarkable. No pancreatic ductal dilatation or surrounding inflammatory changes. Spleen: Normal in size without focal abnormality. Adrenals/Urinary Tract: Adrenal glands are unremarkable. Kidneys are normal, without renal calculi, focal lesion, or hydronephrosis. Bladder is unremarkable. Stomach/Bowel: Contracted stomach. Normal small bowel rotation. There is a small bowel obstruction involving jejunal loops with transition zone in the right lower quadrant possibly from a small adhesion, coronal series 6, image 38, and axial series 3, image 59. Fecalized material is seen within the distal jejunum leading up to the transition point with small bowel measuring up to 4.5 cm in diameter. Vascular/Lymphatic: No significant vascular findings are present. No enlarged abdominal or pelvic lymph nodes. Reproductive: An intrauterine device is seen within the expected location of the endometrial cavity. No uterine mass or adnexal abnormality. Other: No abdominal wall hernia or abnormality. No abdominopelvic ascites. Musculoskeletal: No acute or significant osseous findings. IMPRESSION: High-grade small bowel obstruction with transition  point in the right lower quadrant possibly secondary to an adhesion or subtle stricture. Fecalized material is seen within proximal bowel loops leading up to this area of narrowing with small bowel measuring up to 4.5 cm in caliber.  Electronically Signed   By: Tollie Eth M.D.   On: 06/21/2016 19:39   Dg Abd Portable 1v-small Bowel Obstruction Protocol-initial, 8 Hr Delay  Result Date: 06/22/2016 CLINICAL DATA:  Small bowel obstruction 8 hour delayed film. EXAM: PORTABLE ABDOMEN - 1 VIEW COMPARISON:  06/22/2016 at 0446 hours. FINDINGS: Enteric tube redemonstrated in the stomach. IUD redemonstrated. There is mild prominence of the central small bowel loops, probably unchanged from earlier. Colonic stool and air is redemonstrated. IMPRESSION: Findings consistent with mild partial small bowel obstruction. Enteric tube good position. Electronically Signed   By: Elsie Stain M.D.   On: 06/22/2016 10:24   Dg Abd Portable 1v-small Bowel Protocol-position Verification  Result Date: 06/22/2016 CLINICAL DATA:  Nasogastric tube placement. EXAM: PORTABLE ABDOMEN - 1 VIEW COMPARISON:  CT abdomen and pelvis May 21, 2016 FINDINGS: A few loops of central small bowel is distended to 3.4 cm. Nasogastric tube tip and side port projecting in proximal stomach. No radio-opaque calculi or other significant radiographic abnormality are seen. IMPRESSION: Nasogastric tube tip projects in proximal stomach. Low-grade small bowel obstruction versus focal ileus. Electronically Signed   By: Awilda Metro M.D.   On: 06/22/2016 00:43   Prior to Admission medications   Medication Sig Start Date End Date Taking? Authorizing Provider  acetaminophen (TYLENOL) 500 MG tablet Take 500 mg by mouth every 6 (six) hours as needed for mild pain.   Yes Historical Provider, MD  cholecalciferol (VITAMIN D) 1000 units tablet Take 1,000 Units by mouth daily.   Yes Historical Provider, MD  levonorgestrel (MIRENA) 20 MCG/24HR IUD 1 each by Intrauterine route once.   Yes Historical Provider, MD     Medications: . bisacodyl  10 mg Rectal Daily  . enoxaparin (LOVENOX) injection  40 mg Subcutaneous Q24H  . lip balm  1 application Topical BID  . mouth rinse  15 mL Mouth  Rinse BID   . dextrose 5 % and 0.9% NaCl 125 mL/hr at 06/23/16 0338  . lactated ringers      Assessment/Plan SBO Hx of bowel resection Obesity -  Body mass index is 38. FEN: IV fluids/NPO ID:  NO abx DVT:  Lovenox  Still appears to have SBO  Will send down for film and mobilize more.  Here for cousins Erasmo Score, family going back to [place] today.   Film shows:   Nasogastric catheter is again noted but has been withdrawn slightly in the interval from the previous exam. Proximal side port now lies in the distal esophagus. This should be advanced. Scattered large and small bowel gas is noted. Stable mild dilatation of the small bowel is noted. Fecal material is noted throughout colon. An IUD is noted in the pelvis. No free air is seen.   Will ask nursing to advance NG about 3-4 inches, clamp NG for now and give her an enema.  Get her walking more.  I discussed plan with Nursing.    LOS: 2 days    Roy Snuffer 06/23/2016 [phone]

## 2016-06-23 NOTE — Progress Notes (Addendum)
Per order, advanced NG tube 3 inches, patient tolerated well. Notified patient we start doing gentle, occasional clear liquids while her tube is clamped and if she develops nausea /distention we will resume suction.  Will continue to monitor.

## 2016-06-23 NOTE — Progress Notes (Signed)
Notified PA Marlyne Beards that patient received enema and still did not have a bowel movement. He recommended to have her ambulate more and continue to watch.

## 2016-06-24 MED ORDER — DEXTROSE-NACL 5-0.9 % IV SOLN: INTRAVENOUS | Status: DC

## 2016-06-24 MED ORDER — METOCLOPRAMIDE HCL 5 MG PO TABS: 5.0000 mg | ORAL_TABLET | Freq: Three times a day (TID) | ORAL | 0 refills | Status: AC | PRN

## 2016-06-24 MED ORDER — METOCLOPRAMIDE HCL 5 MG PO TABS: 5.0000 mg | ORAL_TABLET | Freq: Four times a day (QID) | ORAL | Status: DC | PRN

## 2016-06-24 MED ORDER — PROCHLORPERAZINE EDISYLATE 5 MG/ML IJ SOLN: 5.0000 mg | INTRAMUSCULAR | Status: DC | PRN

## 2016-06-24 MED ORDER — VITAMIN D 1000 UNITS PO TABS: ORAL_TABLET | ORAL | Status: AC

## 2016-06-24 MED ORDER — SENNOSIDES-DOCUSATE SODIUM 8.6-50 MG PO TABS
2.0000 | ORAL_TABLET | Freq: Every day | ORAL | Status: DC
  Administered 2016-06-24: 2 via ORAL
  Filled 2016-06-24: qty 2

## 2016-06-24 MED ORDER — ONDANSETRON 4 MG PO TBDP: 4.0000 mg | ORAL_TABLET | Freq: Four times a day (QID) | ORAL | 0 refills | Status: AC | PRN

## 2016-06-24 MED FILL — ONDANSETRON ODT 4 MG TABLET: 4 | 3 days supply | Qty: 10 | Fill #0

## 2016-06-24 MED FILL — METOCLOPRAMIDE 5 MG TABLET: 5 | 3 days supply | Qty: 10 | Fill #0

## 2016-06-24 NOTE — Discharge Summary (Signed)
Physician Discharge Summary  Patient ID: Kathryn Fernandez MRN: [id number] DOB/AGE: [birth year] 31 y.o.  Admit date: 06/21/2016 Discharge date: 06/24/2016  Admission Diagnoses:  High-grade small bowel obstruction History of multiple laparotomies for adhesions History IBS Discharge Diagnoses:  High-grade small bowel obstruction History of multiple laparotomies for adhesions. History of IBS. History of constipation secondary to IBS/she cannot tolerate fiber products. Body mass index is 38.8   Principal Problem:   SBO (small bowel obstruction) ([facility]) Active Problems:   Obesity   PROCEDURES: None  Hospital Course:  The patient presents with a one-day history of abdominal pain to the emergency department. I was asked to see the patient at the request of Dr. Fayrene Fearing. Patient relates developing crampy abdominal pain earlier this morning. She is in town for a family funeral and lives near [place]. During his service, she developed more severe lower crampy abdominal pain. This was associated with nausea and vomiting. She's had multiple episodes of nausea and vomiting. No recent bowel movement today. She is not passing flatus. She has a complex surgical history of multiple laparotomies since age 27 for bowel obstructions. I do not have records for these operations. Her last operation was over 8 years ago. She's had one episode of small bowel obstruction managed nonoperatively. CT scan shows high-grade distal small bowel obstruction.  Patient was admitted and a NG tube was placed. She is placed on low intermittent wall suction. She was made NPO, and placed on IV hydration. Small bowel protocol was instituted.  8 hour delay film after initiation of small bowel protocol showed the NG tube wasn't stomach. There is prominence of the central small bowel loops unchanged from earlier. There is a large amount of colonic stool and air. She was maintained on conservative therapy. Repeat film in the a.m.  06/23/16 showed scattered small bowel gas is noted is stable with mild dilatation in the small bowel as was noted. There was fecal material noted throughout the colon. After reviewing the films we gave her a fleets enema with minimal success. We repeated later in the afternoon with a soapsuds enema. She was placed on sips of clears and ice chips and her NG tube was clamped. She's had good results with the second enema. She is gone from a clear diet this morning to full liquids. She's had bowel movements without further use of enemas. She reported using Senokot and Colace at home so I started her on Senokot S this a.m. She continues to do well. Her family is back in [place] again. If she continues to do well we anticipate discharge later this afternoon. On the left first day on full liquids for at least another 24 hours before advancing to a low fiber diet. She asked for something for abdominal pain on her trip home to [place]. Dr. Corliss Skains is agreeable to Reglan. We are recommending she return to [place] and see her primary care, and follow-up with GI for her IBS.  CBC Latest Ref Rng & Units 06/22/2016 06/21/2016  WBC 4.0 - 10.5 K/uL 12.0(H) 13.0(H)  Hemoglobin 12.0 - 15.0 g/dL 40.9 81.1  Hematocrit 91.4 - 46.0 % 38.9 41.5  Platelets 150 - 400 K/uL 180 212   CMP Latest Ref Rng & Units 06/22/2016 06/21/2016  Glucose 65 - 99 mg/dL 782(N) 562(Z)  BUN 6 - 20 mg/dL 7 7  Creatinine 3.08 - 1.00 mg/dL 6.57 8.46  Sodium 962 - 145 mmol/L 138 136  Potassium 3.5 - 5.1 mmol/L 3.9 4.8  Chloride 101 -  111 mmol/L 106 105  CO2 22 - 32 mmol/L 26 23  Calcium 8.9 - 10.3 mg/dL 9.0 9.4  Total Protein 6.5 - 8.1 g/dL 7.0 7.7  Total Bilirubin 0.3 - 1.2 mg/dL 0.8 1.2  Alkaline Phos 38 - 126 U/L 52 65  AST 15 - 41 U/L 18 30  ALT 14 - 54 U/L 17 19   CT of the abdomen/pelvis with contrast 06/21/16:  High-grade small bowel obstruction with transition point in the right lower quadrant possibly secondary to an adhesion or  subtle stricture. Fecalized material is seen within proximal bowel loops leading up to this area of narrowing with small bowel measuring up to 4.5 cm in caliber.  Single view SBP 8 hour film 06/22/16:Enteric tube redemonstrated in the stomach. IUD redemonstrated. There is mild prominence of the central small bowel loops, probably unchanged from earlier. Colonic stool and air is redemonstrated.  2 view abdominal film 06/23/16:  Nasogastric catheter is again noted but has been withdrawn slightly in the interval from the previous exam. Proximal side port now lies in the distal esophagus. This should be advanced. Scattered large and small bowel gas is noted. Stable mild dilatation of the small bowel is noted. Fecal material is noted throughout colon. An IUD is noted in the pelvis. No free air is seen.  Disposition: Home   Allergies as of 06/24/2016   No Known Allergies     Medication List    TAKE these medications   acetaminophen 500 MG tablet Commonly known as:  TYLENOL Take 500 mg by mouth every 6 (six) hours as needed for mild pain.   cholecalciferol 1000 units tablet Commonly known as:  VITAMIN D Resume next week when you bowel function is normal. What changed:  how much to take  how to take this  when to take this  additional instructions   levonorgestrel 20 MCG/24HR IUD Commonly known as:  MIRENA 1 each by Intrauterine route once.   metoCLOPramide 5 MG tablet Commonly known as:  REGLAN Take 1-2 tablets (5-10 mg total) by mouth every 8 (eight) hours as needed for nausea.   ondansetron 4 MG disintegrating tablet Commonly known as:  ZOFRAN-ODT Take 1 tablet (4 mg total) by mouth every 6 (six) hours as needed for nausea.      Follow-up Information    TSUEI,MATTHEW K., MD Follow up.   Specialty:  General Surgery Why:  Dr. Corliss Skains has been taking care of you during your hospitalization here in [place]. Please be sure to return to your primary care, and  gastroenterologist at home in [place] after you return home. Contact information: [address] [place] [address] [phone]           Signed: Sherrie George 06/24/2016, 3:42 PM

## 2016-06-24 NOTE — Progress Notes (Signed)
Called to patient room she was coughing and spitting up mucous. Pt stated that she was "choking" on NG tube and wanted it out now. Stated that she would pull it out herself. NG was clamped and patient had begun having BM's a few hours before. NG was removed. Patient was educated that if she begins to vomit again NG would have to be reinserted. She verbalized her understanding. Will continue to monitor. Cindee Salt, RN

## 2016-06-24 NOTE — Progress Notes (Signed)
Kathryn Fernandez to be D/C'd  per MD order. Discussed with the patient and all questions fully answered.  VSS, Skin clean, dry and intact without evidence of skin break down, no evidence of skin tears noted.  IV catheter discontinued intact. Site without signs and symptoms of complications. Dressing and pressure applied.  An After Visit Summary was printed and given to the patient. Patient received prescription.  D/c education completed with patient/family including follow up instructions, medication list, d/c activities limitations if indicated, with other d/c instructions as indicated by MD - patient able to verbalize understanding, all questions fully answered.   Patient instructed to return to ED, call 911, or call MD for any changes in condition.   Patient declined wheelchair, wanted to walk out, and D/C home via private auto.

## 2016-06-24 NOTE — Progress Notes (Signed)
Subjective: ZO:[id number] pain  NG came out yesterday PM.  Tolerating the few clears and had a BM without the enema. Asking for a GI anti spasm medicine to go home on for the trip.    Objective: Vital signs in last 24 hours: Temp:  [98.5 F (36.9 C)-99.3 F (37.4 C)] 98.5 F (36.9 C) (05/01 0612) Pulse Rate:  [83-97] 83 (05/01 0612) Resp:  [16-18] 16 (05/01 0612) BP: (122-132)/(57-82) 122/57 (05/01 0612) SpO2:  [100 %] 100 % (05/01 0612) Last BM Date: 06/23/16 240 PO NG is out 500 urine recorded + Bm with enema Afebrile, VSS No labs Intake/Output from previous day: 04/30 0701 - 05/01 0700 In: 2115 [P.O.:240; I.V.:1875] Out: 500 [Urine:500] Intake/Output this shift: No intake/output data recorded.  General appearance: alert, cooperative and no distress Resp: clear to auscultation bilaterally GI: soft, few BS, not distended or tender.    Lab Results:   Recent Labs  06/21/16 1808 06/22/16 0446  WBC 13.0* 12.0*  HGB 13.5 12.2  HCT 41.5 38.9  PLT 212 180    BMET  Recent Labs  06/21/16 1808 06/22/16 0446  NA 136 138  K 4.8 3.9  CL 105 106  CO2 23 26  GLUCOSE 101* 131*  BUN 7 7  CREATININE 0.87 0.84  CALCIUM 9.4 9.0   PT/INR No results for input(s): LABPROT, INR in the last 72 hours.   Recent Labs Lab 06/21/16 1808 06/22/16 0446  AST 30 18  ALT 19 17  ALKPHOS 65 52  BILITOT 1.2 0.8  PROT 7.7 7.0  ALBUMIN 4.4 3.9     Lipase     Component Value Date/Time   LIPASE 25 06/21/2016 1808     Studies/Results: Dg Abd 2 Views  Result Date: 06/23/2016 CLINICAL DATA:  Check nasogastric catheter placement EXAM: ABDOMEN - 2 VIEW COMPARISON:  06/22/2016 FINDINGS: Nasogastric catheter is again noted but has been withdrawn slightly in the interval from the previous exam. Proximal side port now lies in the distal esophagus. This should be advanced. Scattered large and small bowel gas is noted. Stable mild dilatation of the small bowel is noted. Fecal  material is noted throughout colon. An IUD is noted in the pelvis. No free air is seen. IMPRESSION: Nasogastric catheter as described which should be advanced as described. Stable mild dilatation of the small bowel. Electronically Signed   By: Alcide Clever M.D.   On: 06/23/2016 10:30   Dg Abd Portable 1v-small Bowel Obstruction Protocol-initial, 8 Hr Delay  Result Date: 06/22/2016 CLINICAL DATA:  Small bowel obstruction 8 hour delayed film. EXAM: PORTABLE ABDOMEN - 1 VIEW COMPARISON:  06/22/2016 at 0446 hours. FINDINGS: Enteric tube redemonstrated in the stomach. IUD redemonstrated. There is mild prominence of the central small bowel loops, probably unchanged from earlier. Colonic stool and air is redemonstrated. IMPRESSION: Findings consistent with mild partial small bowel obstruction. Enteric tube good position. Electronically Signed   By: Elsie Stain M.D.   On: 06/22/2016 10:24    Medications: . enoxaparin (LOVENOX) injection  40 mg Subcutaneous Q24H  . lip balm  1 application Topical BID  . mouth rinse  15 mL Mouth Rinse BID   . dextrose 5 % and 0.9% NaCl 75 mL/hr at 06/24/16 0522  . lactated ringers      Assessment/Plan SBO Hx of bowel resection Obesity -  Body mass index is 38. FEN: IV fluids/NPO ID:  NO abx DVT:  Lovenox  Plan:  Clear liquids, [place] and ambulate.  Will ask  about GI anti spasmodic for discharge and trip home to [place].  If she does well increase to full liquids.  Possible discharge later today.          LOS: 3 days    Lonn Im 06/24/2016 [phone]

## 2016-06-24 NOTE — Discharge Instructions (Signed)
Small Bowel Obstruction A small bowel obstruction means that something is blocking the small bowel. The small bowel is also called the small intestine. It is the long tube that connects the stomach to the colon. An obstruction will stop food and fluids from passing through the small bowel. Treatment depends on what is causing the problem and how bad the problem is. Follow these instructions at home:  Get a lot of rest.  Follow your diet as told by your doctor. You may need to:  Only drink clear liquids until you start to get better.  Avoid solid foods as told by your doctor.  Take over-the-counter and prescription medicines only as told by your doctor.  Keep all follow-up visits as told by your doctor. This is important. Contact a doctor if:  You have a fever.  You have chills. Get help right away if:  You have pain or cramps that get worse.  You throw up (vomit) blood.  You have a feeling of being sick to your stomach (nausea) that does not go away.  You cannot stop throwing up.  You cannot drink fluids.  You feel confused.  You feel dry or thirsty (dehydrated).  Your belly gets more bloated.  You feel weak or you pass out (faint). This information is not intended to replace advice given to you by your health care provider. Make sure you discuss any questions you have with your health care provider. Document Released: 03/20/2004 Document Revised: 10/08/2015 Document Reviewed: 04/06/2014 Elsevier Interactive Patient Education  2017 [employer] Full Liquid Diet - follow this diet for the next 24-48 hours. A full liquid diet may be used:  To help you transition from a clear liquid diet to a soft diet.  When your body is healing and can only tolerate foods that are easy to digest.  Before or after certain a procedure, test, or surgery (such as stomach or intestinal surgeries).  If you have trouble swallowing or chewing. A full liquid diet includes fluids and foods  that are liquid or will become liquid at room temperature. The full liquid diet gives you the proteins, fluids, salts, and minerals that you need for energy. If you continue this diet for more than 72 hours, talk to your health care provider about how many calories you need to consume. If you continue the diet for more than 5 days, talk to your health care provider about taking a multivitamin or a nutritional supplement. What do I need to know about a full liquid diet?  You may have any liquid.  You may have any food that becomes a liquid at room temperature. The food is considered a liquid if it can be poured off a spoon at room temperature.  Drink one serving of citrus or vitamin C-enriched fruit juice daily. What foods can I eat? Grains  Any grain food that can be pureed in soup (such as crackers, pasta, and rice). Hot cereal (such as farina or oatmeal) that has been blended. Talk to your health care provider or dietitian about these foods. Vegetables  Pulp-free tomato or vegetable juice. Vegetables pureed in soup. Fruits  Fruit juice, including nectars and juices with pulp. Meats and Other Protein Sources  Eggs in custard, eggnog mix, and eggs used in ice cream or pudding. Strained meats, like in baby food, may be allowed. Consult your health care provider. Dairy  Milk and milk-based beverages, including milk shakes and instant breakfast mixes. Smooth yogurt. Pureed cottage cheese. Avoid these foods  if they are not well tolerated. Beverages  All beverages, including liquid nutritional supplements. Ask your health care provider if you can have carbonated beverages. They may not be well tolerated. Condiments  Iodized salt, pepper, spices, and flavorings. Cocoa powder. Vinegar, ketchup, yellow mustard, smooth sauces (such as hollandaise, cheese sauce, or white sauce), and soy sauce. Sweets and Desserts  Custard, smooth pudding. Flavored gelatin. Tapioca, junket. Plain ice cream, sherbet,  fruit ices. Frozen ice pops, frozen fudge pops, pudding pops, and other frozen bars with cream. Syrups, including chocolate syrup. Sugar, honey, jelly. Fats and Oils  Margarine, butter, cream, sour cream, and oils. Other  Broth and cream soups. Strained, broth-based soups. The items listed above may not be a complete list of recommended foods or beverages. Contact your dietitian for more options.  What foods can I not eat? Grains  All breads. Grains are not allowed unless they are pureed into soup. Vegetables  Vegetables are not allowed unless they are juiced, or cooked and pureed into soup. Fruits  Fruits are not allowed unless they are juiced. Meats and Other Protein Sources  Any meat or fish. Cooked or raw eggs. Nut butters. Dairy  Cheese. Condiments  Stone ground mustards. Fats and Oils  Fats that are coarse or chunky. Sweets and Desserts  Ice cream or other frozen desserts that have any solids in them or on top, such as nuts, chocolate chips, and pieces of cookies. Cakes. Cookies. Candy. Others  Soups with chunks or pieces in them. The items listed above may not be a complete list of foods and beverages to avoid. Contact your dietitian for more information.  This information is not intended to replace advice given to you by your health care provider. Make sure you discuss any questions you have with your health care provider. Document Released: 02/10/2005 Document Revised: 07/19/2015 Document Reviewed: 12/16/2012 Elsevier Interactive Patient Education  2017 [employer]  Low-Fiber Diet you can start this in 24-48 hours. I would stay on this for the next few days until you can return to see your primary care physician. Fiber is found in fruits, vegetables, and whole grains. A low-fiber diet restricts fibrous foods that are not digested in the small intestine. A diet containing about 10-15 grams of fiber per day is considered low fiber. Low-fiber diets may be used to:  Promote  healing and rest the bowel during intestinal flare-ups.  Prevent blockage of a partially obstructed or narrowed gastrointestinal tract.  Reduce fecal weight and volume.  Slow the movement of feces. You may be on a low-fiber diet as a transitional diet following surgery, after an injury (trauma), or because of a short (acute) or lifelong (chronic) illness. Your health care provider will determine the length of time you need to stay on this diet. What do I need to know about a low-fiber diet? Always check the fiber content on the packaging's Nutrition Facts label, especially on foods from the grains list. Ask your dietitian if you have questions about specific foods that are related to your condition, especially if the food is not listed below. In general, a low-fiber food will have less than 2 g of fiber. What foods can I eat? Grains  All breads and crackers made with white flour. Sweet rolls, doughnuts, waffles, pancakes, [place] toast, bagels. Pretzels, Melba toast, zwieback. Well-cooked cereals, such as cornmeal, farina, or cream cereals. Dry cereals that do not contain whole grains, fruit, or nuts, such as refined corn, wheat, rice, and oat cereals.  Potatoes prepared any way without skins, plain pastas and noodles, refined white rice. Use white flour for baking and making sauces. Use allowed list of grains for casseroles, dumplings, and puddings. Vegetables  Strained tomato and vegetable juices. Fresh lettuce, cucumber, spinach. Well-cooked (no skin or pulp) or canned vegetables, such as asparagus, bean sprouts, beets, carrots, green beans, mushrooms, potatoes, pumpkin, spinach, yellow squash, tomato sauce/puree, turnips, yams, and zucchini. Keep servings limited to  cup. Fruits  All fruit juices except prune juice. Cooked or canned fruits without skin and seeds, such as applesauce, apricots, cherries, fruit cocktail, grapefruit, grapes, mandarin oranges, melons, peaches, pears, pineapple, and  plums. Fresh fruits without skin, such as apricots, avocados, bananas, melons, pineapple, nectarines, and peaches. Keep servings limited to  cup or 1 piece. Meat and Other Protein Sources  Ground or well-cooked tender beef, ham, veal, lamb, pork, or poultry. Eggs, plain cheese. Fish, oysters, shrimp, lobster, and other seafood. Liver, organ meats. Smooth nut butters. Dairy  All milk products and alternative dairy substitutes, such as soy, rice, almond, and coconut, not containing added whole nuts, seeds, or added fruit. Beverages  Decaf coffee, fruit, and vegetable juices or smoothies (small amounts, with no pulp or skins, and with fruits from allowed list), sports drinks, herbal tea. Condiments  Ketchup, mustard, vinegar, cream sauce, cheese sauce, cocoa powder. Spices in moderation, such as allspice, basil, bay leaves, celery powder or leaves, cinnamon, cumin powder, curry powder, ginger, mace, marjoram, onion or garlic powder, oregano, paprika, parsley flakes, ground pepper, rosemary, sage, savory, tarragon, thyme, and turmeric. Sweets and Desserts  Plain cakes and cookies, pie made with allowed fruit, pudding, custard, cream pie. Gelatin, fruit, ice, sherbet, frozen ice pops. Ice cream, ice milk without nuts. Plain hard candy, honey, jelly, molasses, syrup, sugar, chocolate syrup, gumdrops, marshmallows. Limit overall sugar intake. Fats and Oil  Margarine, butter, cream, mayonnaise, salad oils, plain salad dressings made from allowed foods. Choose healthy fats such as olive oil, canola oil, and omega-3 fatty acids (such as found in salmon or tuna) when possible. Other  Bouillon, broth, or cream soups made from allowed foods. Any strained soup. Casseroles or mixed dishes made with allowed foods. The items listed above may not be a complete list of recommended foods or beverages. Contact your dietitian for more options.  What foods are not recommended? Grains  All whole wheat and whole grain  breads and crackers. Multigrains, rye, bran seeds, nuts, or coconut. Cereals containing whole grains, multigrains, bran, coconut, nuts, raisins. Cooked or dry oatmeal, steel-cut oats. Coarse wheat cereals, granola. Cereals advertised as high fiber. Potato skins. Whole grain pasta, wild or brown rice. Popcorn. Coconut flour. Bran, buckwheat, corn bread, multigrains, rye, wheat germ. Vegetables  Fresh, cooked or canned vegetables, such as artichokes, asparagus, beet greens, broccoli, Brussels sprouts, cabbage, celery, cauliflower, corn, eggplant, kale, legumes or beans, okra, peas, and tomatoes. Avoid large servings of any vegetables, especially raw vegetables. Fruits  Fresh fruits, such as apples with or without skin, berries, cherries, figs, grapes, grapefruit, guavas, kiwis, mangoes, oranges, papayas, pears, persimmons, pineapple, and pomegranate. Prune juice and juices with pulp, stewed or dried prunes. Dried fruits, dates, raisins. Fruit seeds or skins. Avoid large servings of all fresh fruits. Meats and Other Protein Sources  Tough, fibrous meats with gristle. Chunky nut butter. Cheese made with seeds, nuts, or other foods not recommended. Nuts, seeds, legumes (beans, including baked beans), dried peas, beans, lentils. Dairy  Yogurt or cheese that contains nuts, seeds, or added  fruit. Beverages  Fruit juices with high pulp, prune juice. Caffeinated coffee and teas. Condiments  Coconut, maple syrup, pickles, olives. Sweets and Desserts  Desserts, cookies, or candies that contain nuts or coconut, chunky peanut butter, dried fruits. Jams, preserves with seeds, marmalade. Large amounts of sugar and sweets. Any other dessert made with fruits from the not recommended list. Other  Soups made from vegetables that are not recommended or that contain other foods not recommended. The items listed above may not be a complete list of foods and beverages to avoid. Contact your dietitian for more information.    This information is not intended to replace advice given to you by your health care provider. Make sure you discuss any questions you have with your health care provider.  Document Released: 08/02/2001 Document Revised: 07/19/2015 Document Reviewed: 01/03/2013 Elsevier Interactive Patient Education  2017 [employer].

## 2016-06-24 NOTE — Care Management Note (Signed)
Case Management Note  Patient Details  Name: Kathryn Fernandez MRN: [id number] Date of Birth: 1985-08-21  Subjective/Objective:                    Action/Plan:   Expected Discharge Date:                  Expected Discharge Plan:  Home/Self Care  In-House Referral:     Discharge planning Services  CM Consult, Medication Assistance, MATCH Program  Post Acute Care Choice:    Choice offered to:  Patient  DME Arranged:    DME Agency:     HH Arranged:    HH Agency:     Status of Service:  Completed, signed off  If discussed at [employer] of Stay Meetings, dates discussed:    Additional Comments:  Kingsley Plan, RN 06/24/2016, 2:09 PM

## 2018-11-26 IMAGING — CR DG ABDOMEN 2V
2 series · 2 of 2 positions shown · non-contrast
Comparison: 06/22/2016

CLINICAL DATA: Check nasogastric catheter placement

EXAM:
ABDOMEN - 2 VIEW

[abdomen supine]
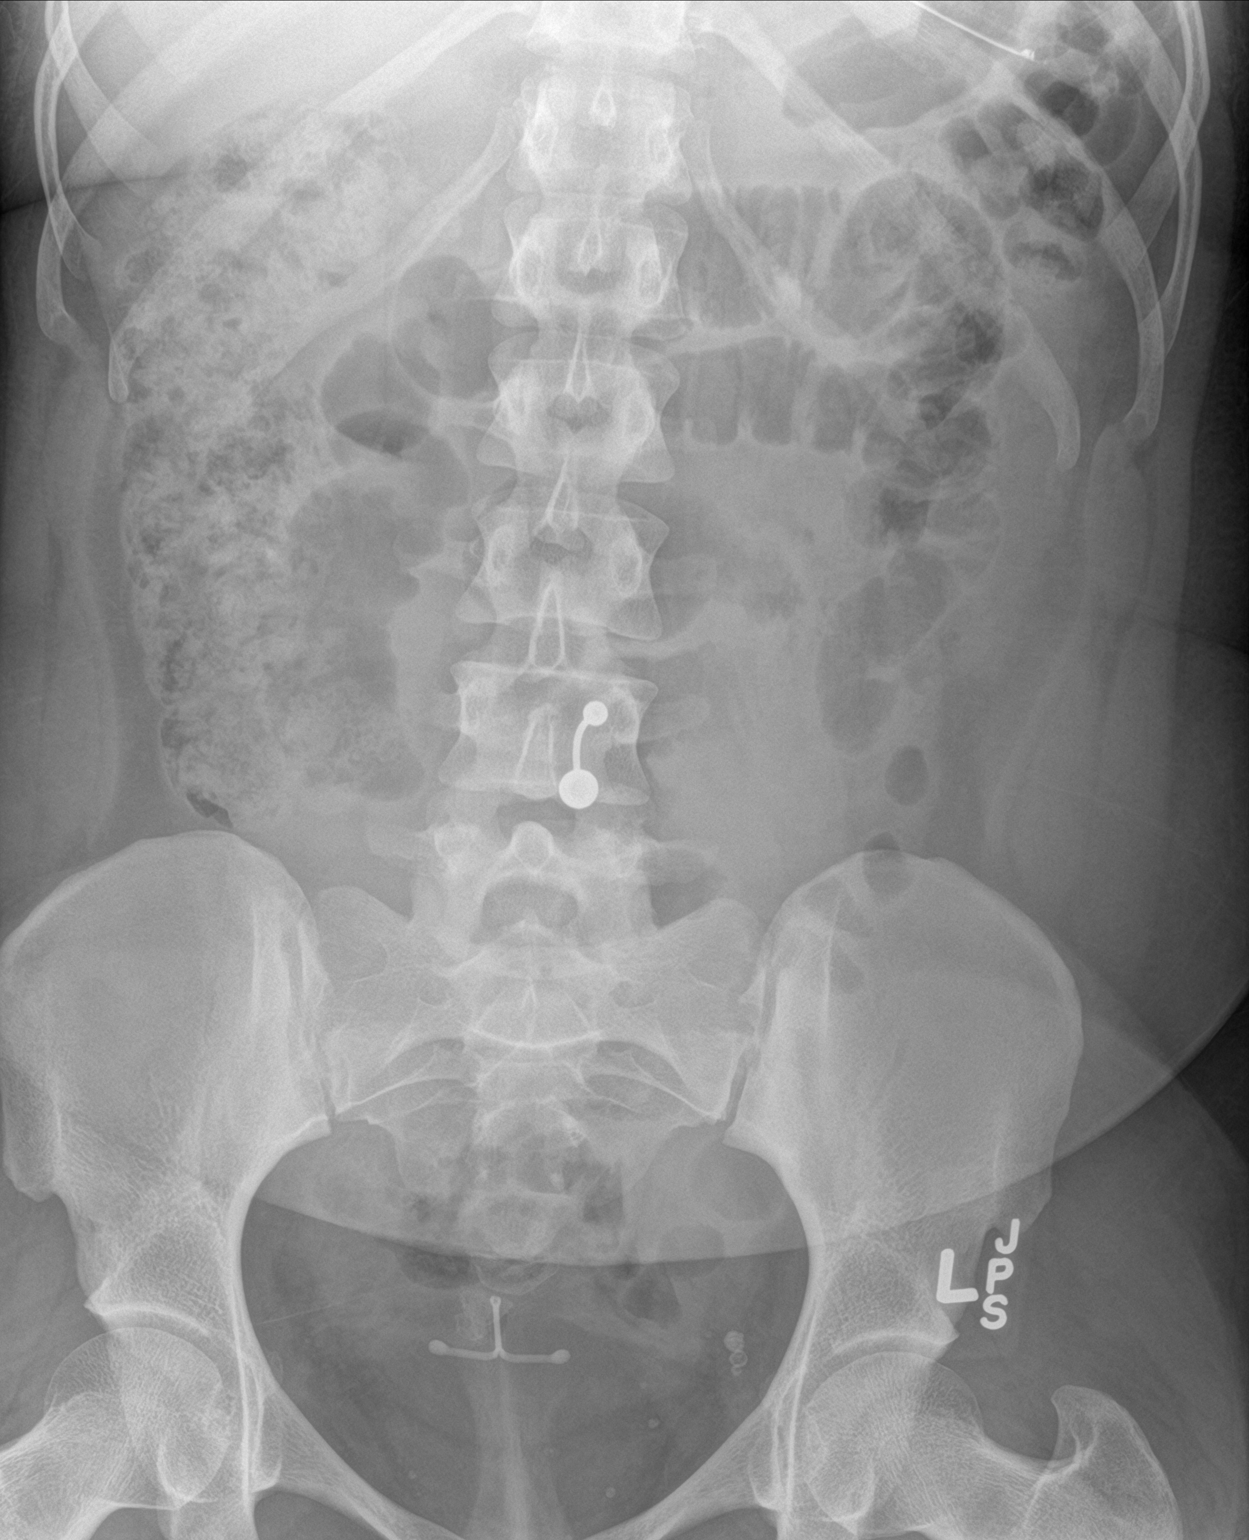

[abdomen erect]
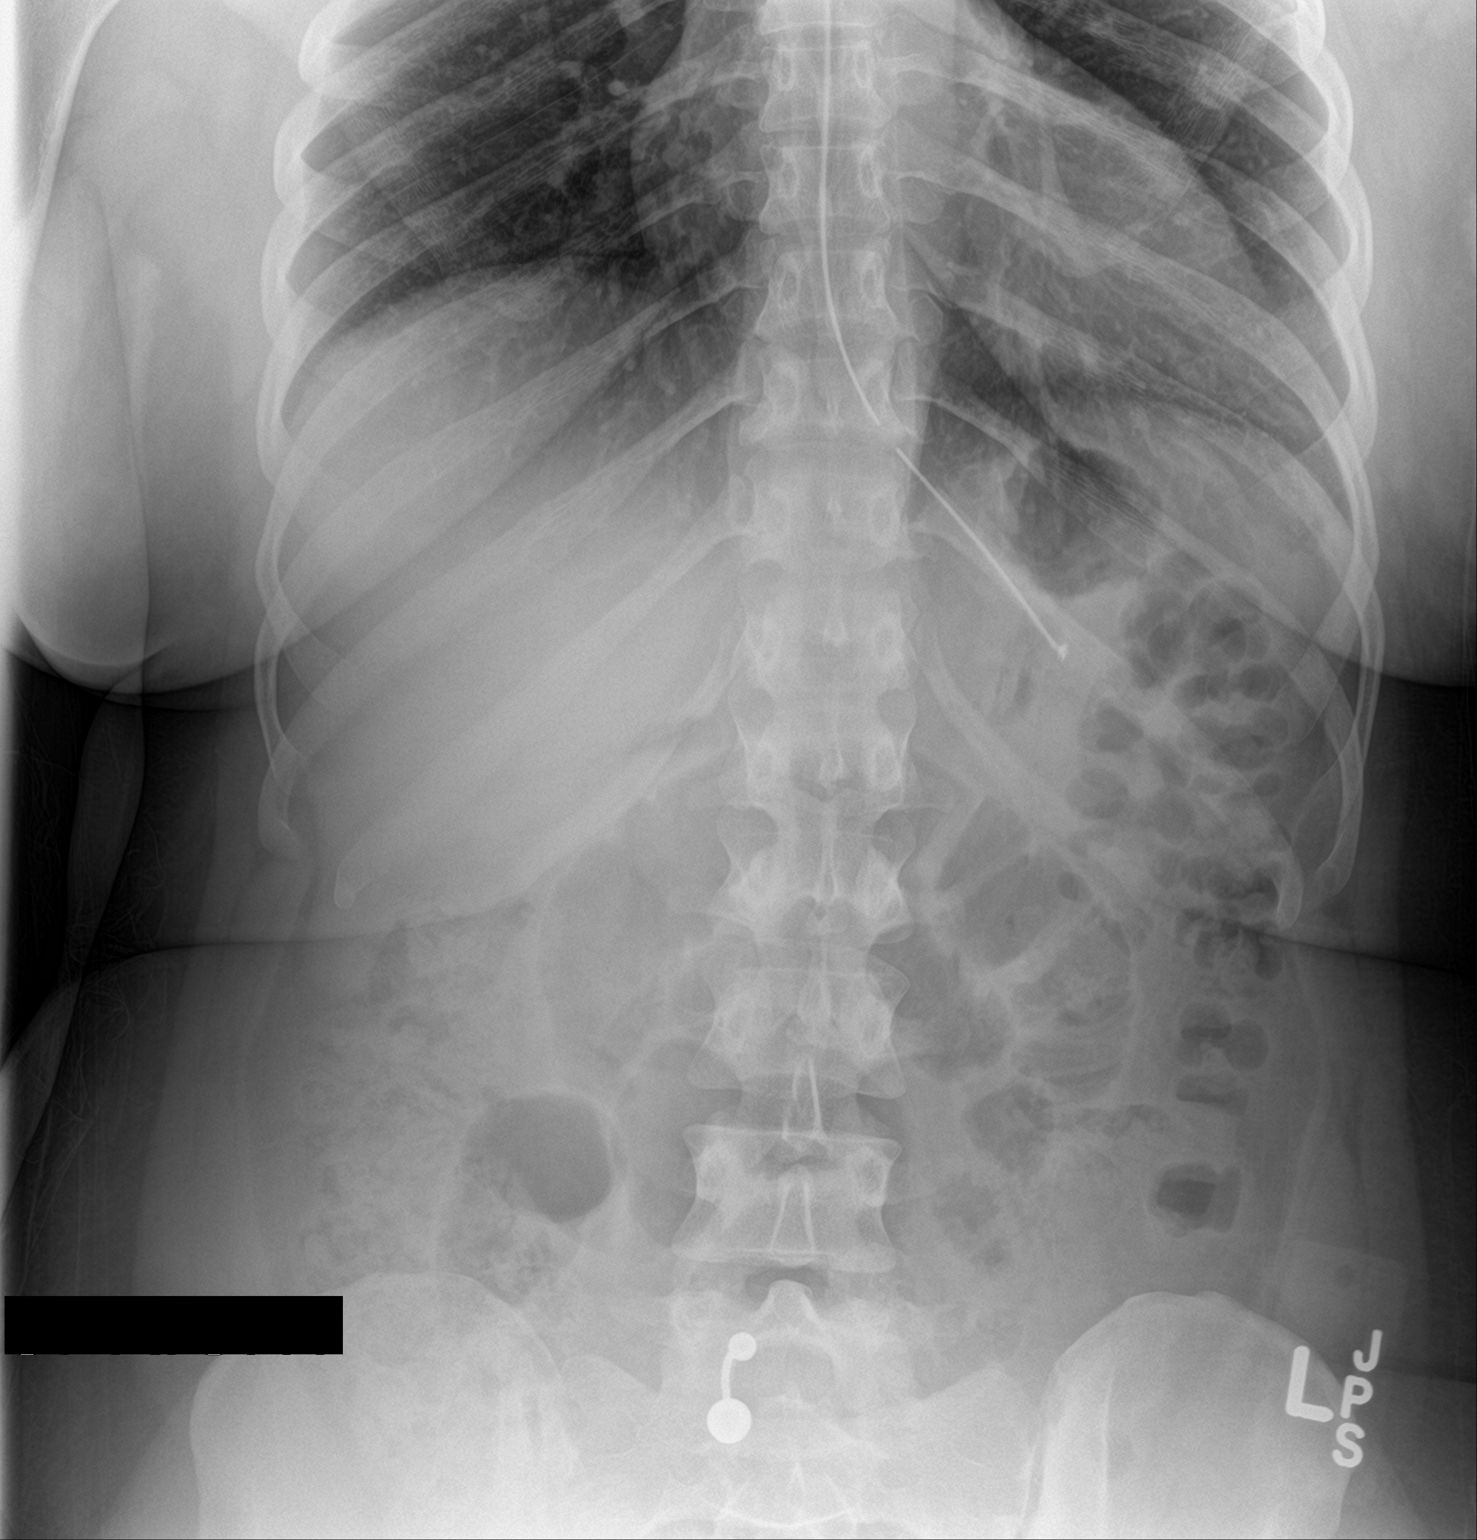

[2 of 2 positions shown; findings below may reference images not displayed]

FINDINGS: Nasogastric catheter is again noted but has been withdrawn slightly
in the interval from the previous exam. Proximal side port now lies
in the distal esophagus. This should be advanced. Scattered large
and small bowel gas is noted. Stable mild dilatation of the small
bowel is noted. Fecal material is noted throughout colon. An IUD is
noted in the pelvis. No free air is seen.
IMPRESSION: Nasogastric catheter as described which should be advanced as
described.

Stable mild dilatation of the small bowel.
